# Patient Record
Sex: Female | Born: 1995 | Race: White | Marital: Single
Health system: Southern US, Community
[De-identification: ages and names within clinical notes are randomized; demographics above are authoritative.]

---
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
## 2018-02-22 IMAGING — DX DG FOOT COMPLETE 3+V*R*
3 series · 3 of 3 positions shown · non-contrast
Comparison: None.

CLINICAL DATA: Pt to ED c/o right foot and right ankle pain, pt
reports tripping and falling last night, pain worse when
dorsiflexing right foot, multiple healing abrasions.

EXAM:
RIGHT FOOT COMPLETE - 3+ VIEW

[foot ap]
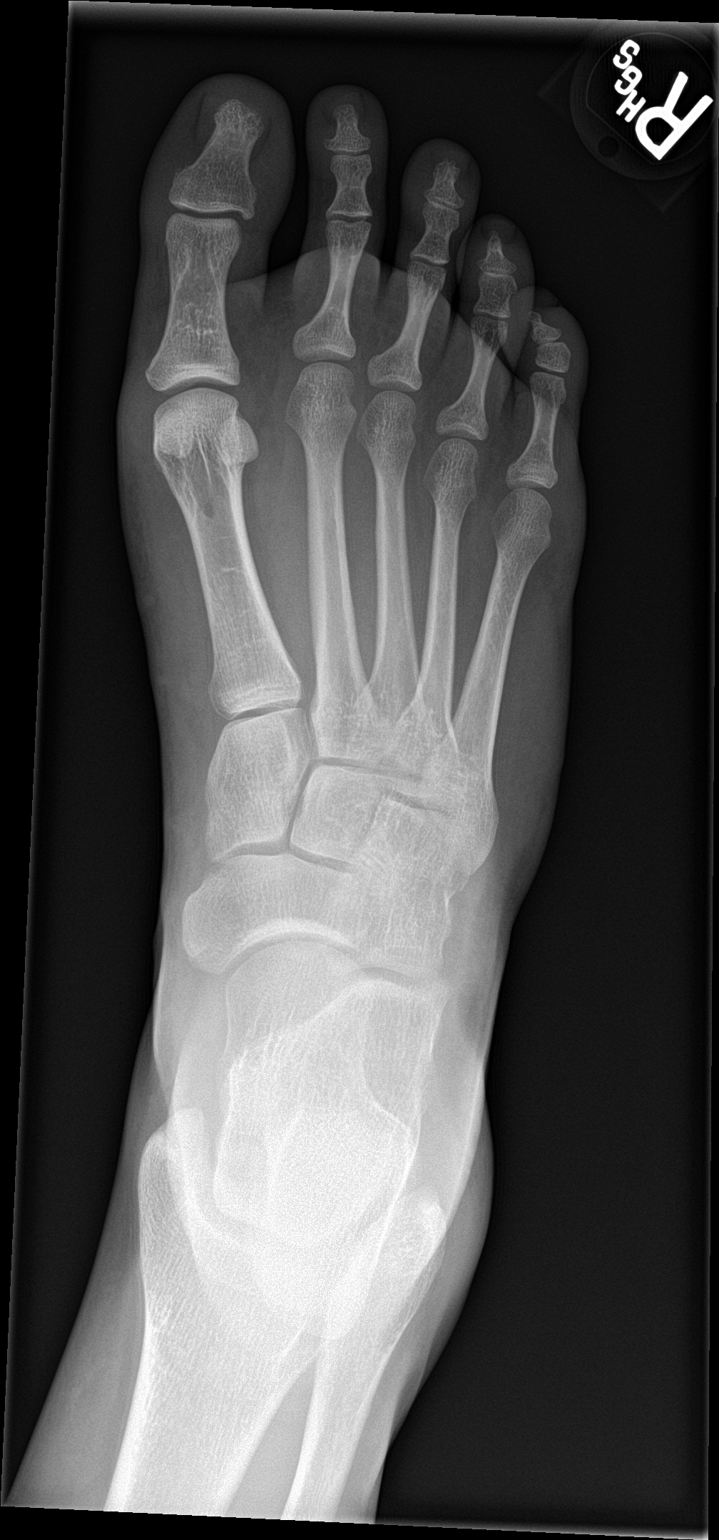

[foot obl]
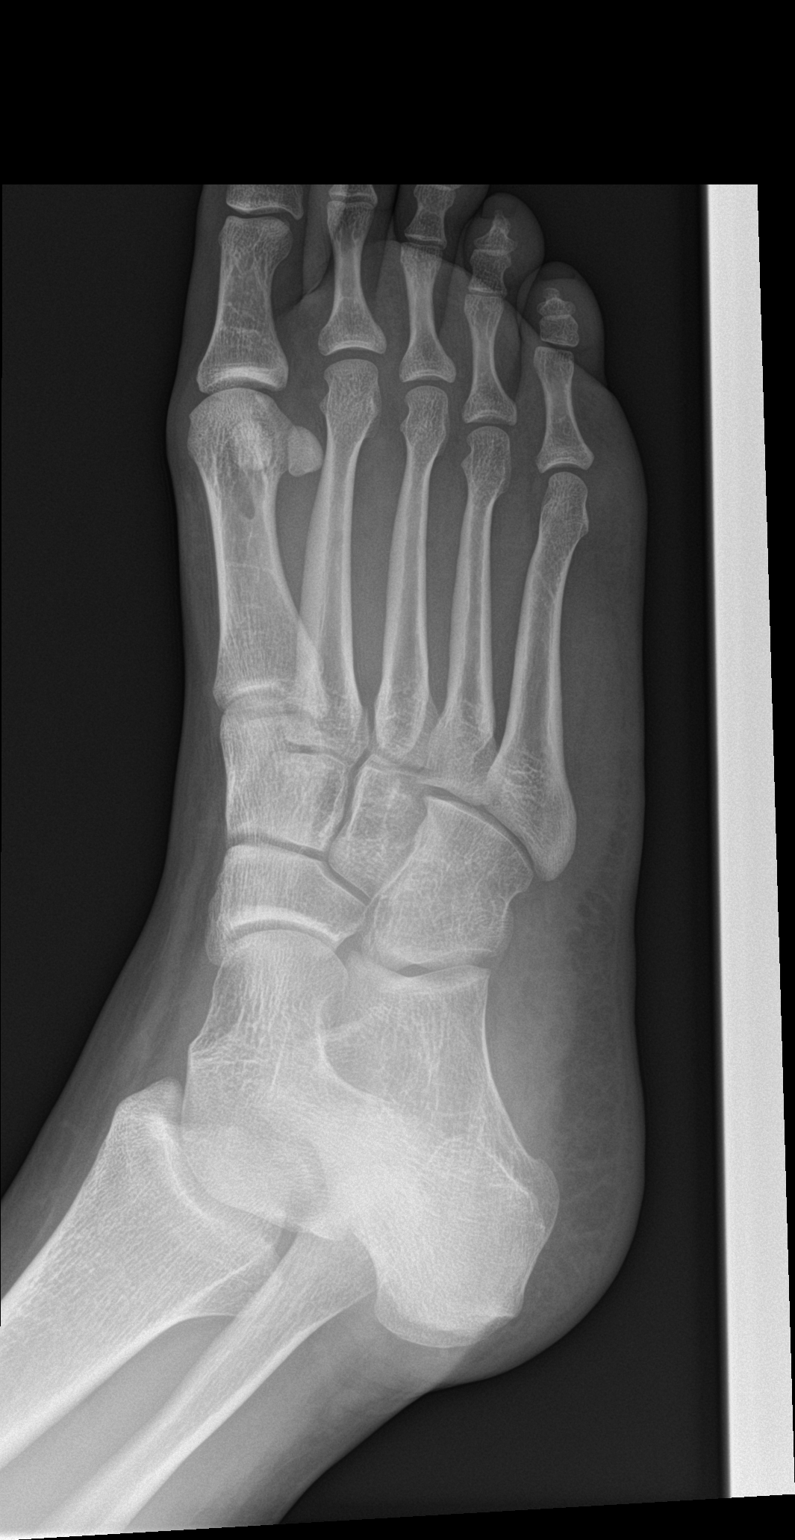

[foot lat]
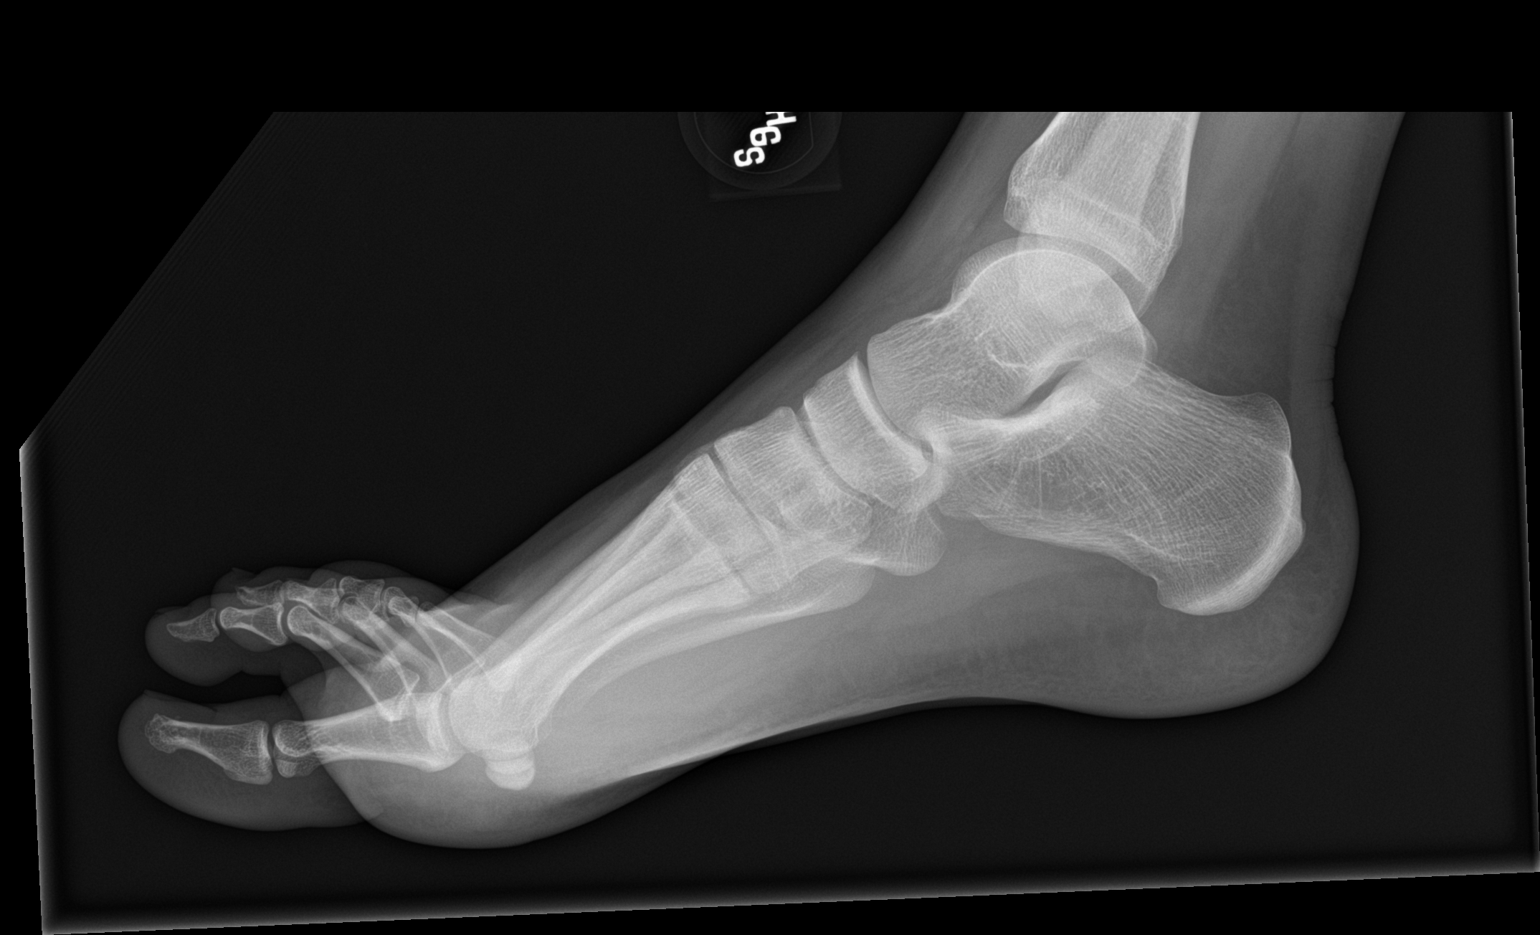

[3 of 3 positions shown; findings below may reference images not displayed]

FINDINGS: There is no evidence of fracture or dislocation. There is no
evidence of arthropathy or other focal bone abnormality. Soft
tissues are unremarkable.
IMPRESSION: Negative.
